# Patient Record
Sex: Male | Born: 1995 | Race: White | Hispanic: No | Marital: Single | Smoking: Never smoker
Health system: Southern US, Community
[De-identification: ages and names within clinical notes are randomized; demographics above are authoritative.]

---
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
## 2016-10-14 ENCOUNTER — Encounter: Payer: Self-pay | Admitting: *Deleted

## 2016-10-14 ENCOUNTER — Ambulatory Visit
Admission: EM | Admit: 2016-10-14 | Discharge: 2016-10-14 | Disposition: A | Discharge status: Home / Self Care | Payer: BLUE CROSS/BLUE SHIELD | Attending: Family Medicine | Admitting: Family Medicine

## 2016-10-14 DIAGNOSIS — J020 Streptococcal pharyngitis: Secondary | ICD-10-CM

## 2016-10-14 LAB — RAPID STREP SCREEN (MED CTR MEBANE ONLY): Streptococcus, Group A Screen (Direct): POSITIVE — AB

## 2016-10-14 MED ORDER — PENICILLIN V POTASSIUM 500 MG PO TABS: 500.0000 mg | ORAL_TABLET | Freq: Three times a day (TID) | ORAL | 0 refills | Status: DC

## 2016-10-14 NOTE — ED Triage Notes (Signed)
Hoarseness, sore throat, intermittent fever, head congestion, runny nose x 3 days.

## 2016-10-14 NOTE — ED Provider Notes (Signed)
[facility]    CSN: [id number] Arrival date & time: 10/14/16  1323     History   Chief Complaint Chief Complaint  Patient presents with  . Sore Throat  . Nasal Congestion  . Hoarse  . Fever    HPI Ricardo Hill is a 21 y.o. male.    Sore Throat  This is a new problem. The current episode started more than 2 days ago. The problem occurs constantly. The problem has been gradually worsening. Pertinent negatives include no chest pain, no abdominal pain, no headaches and no shortness of breath. The symptoms are relieved by acetaminophen and NSAIDs. He has tried acetaminophen for the symptoms. The treatment provided mild relief.  Fever  Associated symptoms: no chest pain and no headaches     History reviewed. No pertinent past medical history.  There are no active problems to display for this patient.   History reviewed. No pertinent surgical history.     Home Medications    Prior to Admission medications   Medication Sig Start Date End Date Taking? Authorizing Provider  azithromycin (ZITHROMAX Z-PAK) 250 MG tablet Use as directed for 5 days. 03/14/15   Jolene ProvostPatel, Kirtida, MD  penicillin v potassium (VEETID) 500 MG tablet Take 1 tablet (500 mg total) by mouth 3 (three) times daily. 10/14/16   Payton Mccallumonty, Nancye Grumbine, MD    Family History History reviewed. No pertinent family history.  Social History Social History  Substance Use Topics  . Smoking status: Never Smoker  . Smokeless tobacco: Current User  . Alcohol use No     Allergies   Patient has no known allergies.   Review of Systems Review of Systems  Constitutional: Positive for fever.  Respiratory: Negative for shortness of breath.   Cardiovascular: Negative for chest pain.  Gastrointestinal: Negative for abdominal pain.  Neurological: Negative for headaches.     Physical Exam Triage Vital Signs ED Triage Vitals  Enc Vitals Group     BP 10/14/16 1358 (!) 125/55     Pulse Rate 10/14/16 1358 84     Resp 10/14/16 1358 16     Temp 10/14/16 1358 98.8 F (37.1 C)     Temp Source 10/14/16 1358 Oral     SpO2 10/14/16 1358 100 %     Weight 10/14/16 1401 220 lb (99.8 kg)     Height 10/14/16 1401 6\' 1"  (1.854 m)     Head Circumference --      Peak Flow --      Pain Score 10/14/16 1435 4     Pain Loc --      Pain Edu? --      Excl. in GC? --    No data found.   Updated Vital Signs BP (!) 125/55 (BP Location: Left Arm)   Pulse 84   Temp 98.8 F (37.1 C) (Oral)   Resp 16   Ht 6\' 1"  (1.854 m)   Wt 220 lb (99.8 kg)   SpO2 100%   BMI 29.03 kg/m   Visual Acuity Right Eye Distance:   Left Eye Distance:   Bilateral Distance:    Right Eye Near:   Left Eye Near:    Bilateral Near:     Physical Exam  Constitutional: He appears well-developed and well-nourished. No distress.  HENT:  Head: Normocephalic and atraumatic.  Right Ear: Tympanic membrane, external ear and ear canal normal.  Left Ear: Tympanic membrane, external ear and ear canal normal.  Nose: Nose normal.  Mouth/Throat:  Uvula is midline and mucous membranes are normal. Posterior oropharyngeal erythema present. No oropharyngeal exudate, posterior oropharyngeal edema or tonsillar abscesses.  Eyes: Pupils are equal, round, and reactive to light. Conjunctivae and EOM are normal. Right eye exhibits no discharge. Left eye exhibits no discharge. No scleral icterus.  Neck: Normal range of motion. Neck supple. No tracheal deviation present. No thyromegaly present.  Cardiovascular: Normal rate, regular rhythm and normal heart sounds.   Pulmonary/Chest: Effort normal and breath sounds normal. No stridor. No respiratory distress. He has no wheezes. He has no rales. He exhibits no tenderness.  Lymphadenopathy:    He has no cervical adenopathy.  Neurological: He is alert.  Skin: Skin is warm and dry. No rash noted. He is not diaphoretic.  Nursing note and vitals reviewed.    UC Treatments / Results  Labs (all labs ordered  are listed, but only abnormal results are displayed) Labs Reviewed  RAPID STREP SCREEN (NOT AT [facility]) - Abnormal; Notable for the following:       Result Value   Streptococcus, Group A Screen (Direct) POSITIVE (*)    All other components within normal limits    EKG  EKG Interpretation None       Radiology No results found.  Procedures Procedures (including critical care time)  Medications Ordered in UC Medications - No data to display   Initial Impression / Assessment and Plan / UC Course  I have reviewed the triage vital signs and the nursing notes.  Pertinent labs & imaging results that were available during my care of the patient were reviewed by me and considered in my medical decision making (see chart for details).       Final Clinical Impressions(s) / UC Diagnoses   Final diagnoses:  Strep pharyngitis    New Prescriptions Discharge Medication List as of 10/14/2016  2:33 PM    START taking these medications   Details  penicillin v potassium (VEETID) 500 MG tablet Take 1 tablet (500 mg total) by mouth 3 (three) times daily., Starting Tue 10/14/2016, Normal       1. Lab results and diagnosis reviewed with patient 2. rx as per orders above; reviewed possible side effects, interactions, risks and benefits  3. Recommend supportive treatment with salt water gargles, otc analgesics prn 4. Follow-up prn if symptoms worsen or don't improve   Payton Mccallum, MD 10/14/16 1526

## 2017-10-26 ENCOUNTER — Ambulatory Visit
Admission: EM | Admit: 2017-10-26 | Discharge: 2017-10-26 | Disposition: A | Discharge status: Home / Self Care | Payer: BLUE CROSS/BLUE SHIELD | Attending: Family Medicine | Admitting: Family Medicine

## 2017-10-26 ENCOUNTER — Ambulatory Visit (INDEPENDENT_AMBULATORY_CARE_PROVIDER_SITE_OTHER): Payer: BLUE CROSS/BLUE SHIELD

## 2017-10-26 ENCOUNTER — Other Ambulatory Visit: Payer: Self-pay

## 2017-10-26 ENCOUNTER — Encounter: Payer: Self-pay | Admitting: Emergency Medicine

## 2017-10-26 DIAGNOSIS — L02419 Cutaneous abscess of limb, unspecified: Secondary | ICD-10-CM

## 2017-10-26 DIAGNOSIS — M25461 Effusion, right knee: Secondary | ICD-10-CM

## 2017-10-26 DIAGNOSIS — L03115 Cellulitis of right lower limb: Secondary | ICD-10-CM

## 2017-10-26 DIAGNOSIS — L02415 Cutaneous abscess of right lower limb: Secondary | ICD-10-CM

## 2017-10-26 DIAGNOSIS — L03119 Cellulitis of unspecified part of limb: Principal | ICD-10-CM

## 2017-10-26 MED ORDER — DOXYCYCLINE HYCLATE 100 MG PO CAPS: 100.0000 mg | ORAL_CAPSULE | Freq: Two times a day (BID) | ORAL | 0 refills | Status: AC

## 2017-10-26 MED ORDER — MUPIROCIN 2 % EX OINT: 1.0000 "application " | TOPICAL_OINTMENT | Freq: Three times a day (TID) | CUTANEOUS | 0 refills | Status: AC

## 2017-10-26 NOTE — ED Triage Notes (Signed)
Patient c/o right knee pain and swelling for the past couple of weeks.  Patient denies any fall or injury.

## 2017-10-26 NOTE — Discharge Instructions (Signed)
Apply warm compresses to your right knee 3-4 times daily, leave in place for 10 minutes.  Then thoroughly dry your knee and apply mupirocin ointment to the area and cover with a gauze dressing secured with Coban.  If any signs or symptoms of infection occur which we discussed including warmth ,drainage ,increasing pain or swelling or red streaks going up your leg ,return to our clinic or go to the emergency room immediately.

## 2017-10-26 NOTE — ED Provider Notes (Signed)
[facility]    CSN: [id number] Arrival date & time: 10/26/17  1043     History   Chief Complaint Chief Complaint  Patient presents with  . Knee Pain    right    HPI Ricardo Hill is a 22 y.o. male.   HPI  22 year old male presents with right anterior knee pain and swelling that has had for couple of weeks.  Denies any fall or injury.  In retrospect he thinks he may have scraped it on a pool when he was exiting.  Over that period of time it seemed that he has lost hair over the area of the tibial tubercle and has noticed swelling and pain along with hardness.  Today he stated the pain was very extensive and even to the point of having to lancet with his own pen knife.  He states that he only got  blood of the incision.  Now is left with eschar over the area.  Had no fever or chills.  He is afebrile today in the office. No  Difficulty with range of motion and has no difficulty with walking.  Works as Personnel officer at [employer].        History reviewed. No pertinent past medical history.  There are no active problems to display for this patient.   History reviewed. No pertinent surgical history.     Home Medications    Prior to Admission medications   Medication Sig Start Date End Date Taking? Authorizing Provider  doxycycline (VIBRAMYCIN) 100 MG capsule Take 1 capsule (100 mg total) by mouth 2 (two) times daily. 10/26/17   Lutricia Feil, PA-C  mupirocin ointment (BACTROBAN) 2 % Apply 1 application topically 3 (three) times daily. 10/26/17   Lutricia Feil, PA-C    Family History History reviewed. No pertinent family history.  Social History Social History   Tobacco Use  . Smoking status: Never Smoker  . Smokeless tobacco: Current User    Types: Chew  Substance Use Topics  . Alcohol use: No  . Drug use: No     Allergies   Patient has no known allergies.   Review of Systems Review of Systems  Constitutional: Positive for activity change.  Negative for appetite change, chills, fatigue and fever.  Musculoskeletal: Positive for arthralgias.  Skin: Positive for wound.  All other systems reviewed and are negative.    Physical Exam Triage Vital Signs ED Triage Vitals  Enc Vitals Group     BP 10/26/17 1053 128/72     Pulse Rate 10/26/17 1053 65     Resp 10/26/17 1053 16     Temp 10/26/17 1053 97.9 F (36.6 C)     Temp Source 10/26/17 1053 Oral     SpO2 10/26/17 1053 100 %     Weight 10/26/17 1050 200 lb (90.7 kg)     Height 10/26/17 1050 6' (1.829 m)     Head Circumference --      Peak Flow --      Pain Score 10/26/17 1050 8     Pain Loc --      Pain Edu? --      Excl. in GC? --    No data found.  Updated Vital Signs BP 128/72 (BP Location: Left Arm)   Pulse 65   Temp 97.9 F (36.6 C) (Oral)   Resp 16   Ht 6' (1.829 m)   Wt 200 lb (90.7 kg)   SpO2 100%   BMI 27.12 kg/m  Visual Acuity Right Eye Distance:   Left Eye Distance:   Bilateral Distance:    Right Eye Near:   Left Eye Near:    Bilateral Near:     Physical Exam  Constitutional: He is oriented to person, place, and time. He appears well-developed and well-nourished. No distress.  HENT:  Head: Normocephalic.  Eyes: Pupils are equal, round, and reactive to light. Right eye exhibits no discharge. Left eye exhibits no discharge.  Neck: Normal range of motion.  Musculoskeletal: Normal range of motion. He exhibits edema and tenderness.  Of the right knee swelling and eschar over 1/2 cm circular lesion over the tibial tubercle.  Extends laterally and medially.  There is no significant fluctuance induration is present under the lesion.  He has full range of motion that is comfortable.  Collateral ligaments and anterior cruciate ligament is intact.  No significant patellofemoral tenderness present.  Neurological: He is alert and oriented to person, place, and time.  Skin: Skin is warm and dry. He is not diaphoretic.  Psychiatric: He has a normal mood  and affect. His behavior is normal. Judgment and thought content normal.  Nursing note and vitals reviewed.        UC Treatments / Results  Labs (all labs ordered are listed, but only abnormal results are displayed) Labs Reviewed - No data to display  EKG None  Radiology Dg Knee Complete 4 Views Right  Result Date: 10/26/2017 CLINICAL DATA:  Anterior right knee pain and swelling since the patient scraped his knee just inferior to the patella while swimming 2 weeks ago. EXAM: RIGHT KNEE - COMPLETE 4+ VIEW COMPARISON:  None. FINDINGS: No evidence of fracture, dislocation, or joint effusion. No evidence of arthropathy or other focal bone abnormality. There is some soft tissue swelling anterior to the patella. No radiopaque foreign body or soft tissue gas is identified. IMPRESSION: Soft tissue swelling anterior to the patella without radiopaque foreign body or soft tissue gas. The study is otherwise normal. Electronically Signed   By: Drusilla Kanner M.D.   On: 10/26/2017 11:42    Procedures Procedures (including critical care time)  Medications Ordered in UC Medications - No data to display  Initial Impression / Assessment and Plan / UC Course  I have reviewed the triage vital signs and the nursing notes.  Pertinent labs & imaging results that were available during my care of the patient were reviewed by me and considered in my medical decision making (see chart for details).     Plan: 1. Test/x-ray results and diagnosis reviewed with patient 2. rx as per orders; risks, benefits, potential side effects reviewed with patient 3. Recommend supportive treatment with warm compresses 3-4 times daily lasting 10 minutes at a time. Dry  Thoroughly and then apply Bactroban ointment to the area of induration. Cover with a gauze dressing secured with co-band.  Treat also with oral antibiotics.  If he notices any worsening or any pointing of the abscess he should return to our clinic for  I&D. 4. F/u prn if symptoms worsen or don't improve  Final Clinical Impressions(s) / UC Diagnoses   Final diagnoses:  Cellulitis and abscess of leg     Discharge Instructions     Apply warm compresses to your right knee 3-4 times daily, leave in place for 10 minutes.  Then thoroughly dry your knee and apply mupirocin ointment to the area and cover with a gauze dressing secured with Coban.  If any signs or symptoms of infection  occur which we discussed including warmth ,drainage ,increasing pain or swelling or red streaks going up your leg ,return to our clinic or go to the emergency room immediately.    ED Prescriptions    Medication Sig Dispense Auth. Provider   doxycycline (VIBRAMYCIN) 100 MG capsule Take 1 capsule (100 mg total) by mouth 2 (two) times daily. 10 capsule Ovid Curdoemer, William P, PA-C   mupirocin ointment (BACTROBAN) 2 % Apply 1 application topically 3 (three) times daily. 22 g Lutricia Feiloemer, William P, PA-C     Controlled Substance Prescriptions [place] Controlled Substance Registry consulted? Not Applicable   Lutricia FeilRoemer, William P, PA-C 10/26/17 1348

## 2017-11-12 ENCOUNTER — Other Ambulatory Visit: Payer: Self-pay | Admitting: Physician Assistant

## 2017-11-12 DIAGNOSIS — R1310 Dysphagia, unspecified: Secondary | ICD-10-CM

## 2017-11-18 ENCOUNTER — Ambulatory Visit: Payer: BLUE CROSS/BLUE SHIELD | Attending: Physician Assistant

## 2018-11-30 ENCOUNTER — Other Ambulatory Visit: Payer: Self-pay | Admitting: Otolaryngology

## 2018-11-30 ENCOUNTER — Other Ambulatory Visit (HOSPITAL_COMMUNITY): Payer: Self-pay | Admitting: Otolaryngology

## 2018-11-30 DIAGNOSIS — R1310 Dysphagia, unspecified: Secondary | ICD-10-CM

## 2018-12-02 ENCOUNTER — Ambulatory Visit: Payer: BLUE CROSS/BLUE SHIELD

## 2018-12-02 ENCOUNTER — Ambulatory Visit
Admission: RE | Admit: 2018-12-02 | Discharge: 2018-12-02 | Disposition: A | Discharge status: Home / Self Care | Payer: BC Managed Care – PPO | Source: Ambulatory Visit | Attending: Otolaryngology | Admitting: Otolaryngology

## 2018-12-02 ENCOUNTER — Other Ambulatory Visit: Payer: Self-pay

## 2018-12-02 DIAGNOSIS — R1310 Dysphagia, unspecified: Secondary | ICD-10-CM | POA: Insufficient documentation

## 2020-07-19 IMAGING — RF DG ESOPHAGUS
8 of 9 series · 14 of 24 positions shown · non-contrast
Comparison: None.

CLINICAL DATA: Indigestion

EXAM:
ESOPHOGRAM / BARIUM SWALLOW / BARIUM TABLET STUDY
TECHNIQUE: Combined double contrast and single contrast examination performed
using effervescent crystals, thick barium liquid, and thin barium
liquid. The patient was observed with fluoroscopy swallowing a 13 mm
barium sulphate tablet.
FLUOROSCOPY TIME:  Fluoroscopy Time:  [DATE]
Number of Acquired Spot Images: 32

[Series 1: fluoro_barium 2fps_bw · 0.18mm/px · 2 of 8 frames shown (1 of 8)]
[frame 2/8]
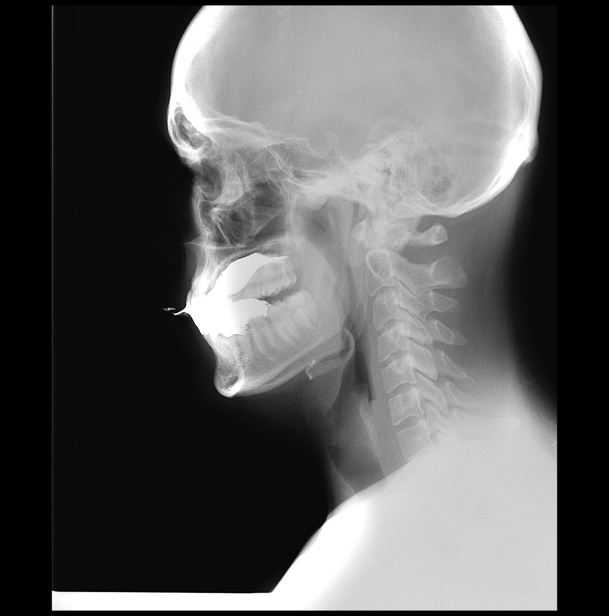
[frame 5/8]
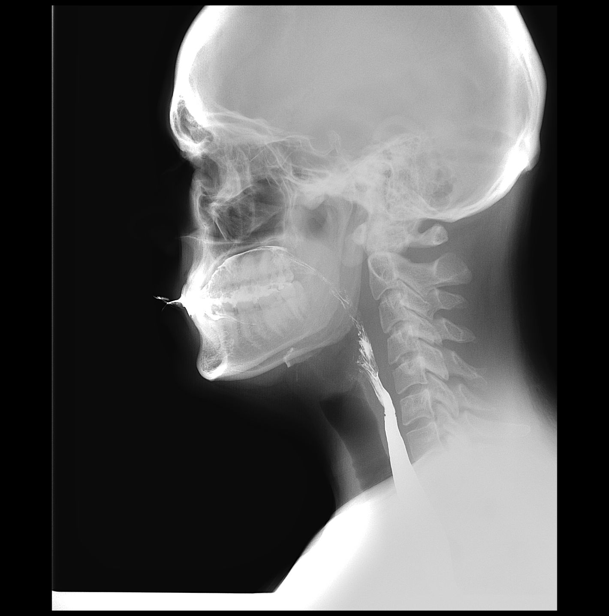

[Series 2: fluoro_barium 2fps_bw · 0.18mm/px · 3 of 6 frames shown (2 of 8)]
[frame 1/6]
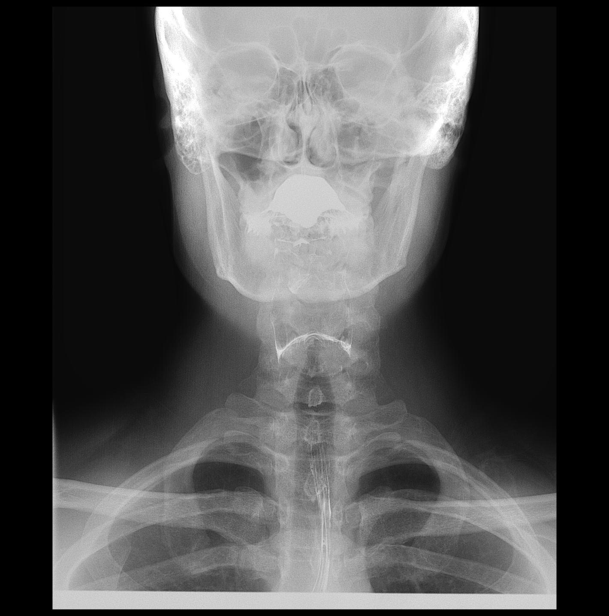
[frame 4/6]
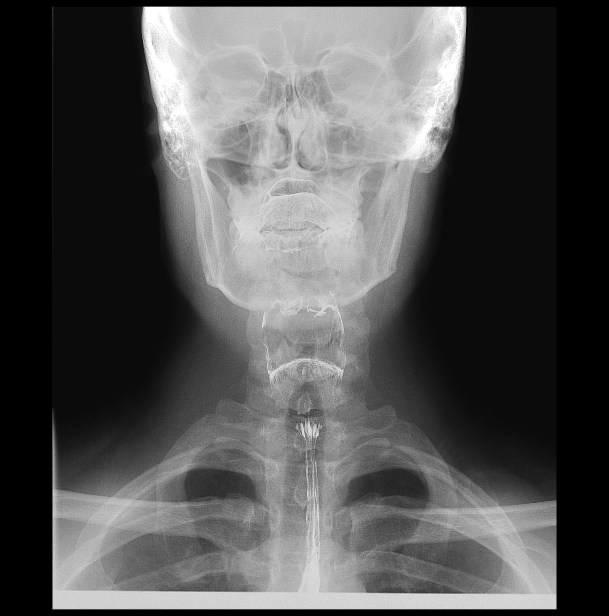
[frame 6/6]
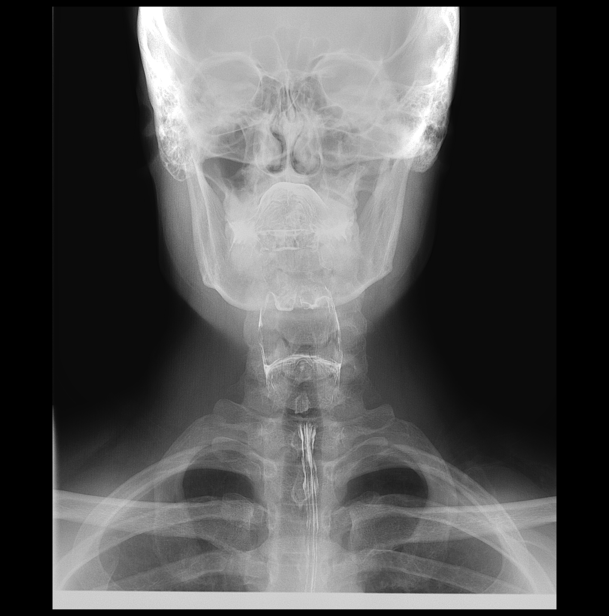

[Series 3: fluoro_barium 2fps_bw · 0.18mm/px · 1 of 4 frames shown (3 of 8)]
[frame 3/4]
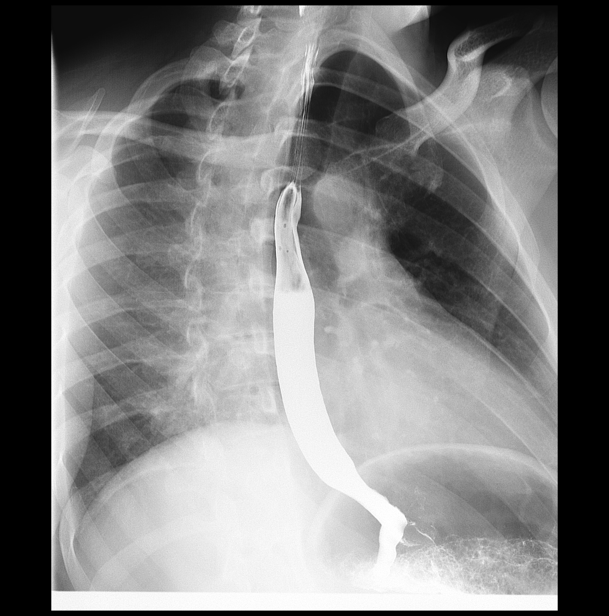

[Series 4: fluoro_barium 2fps_bw · 0.18mm/px · 2 of 3 frames shown (4 of 8)]
[frame 1/3]
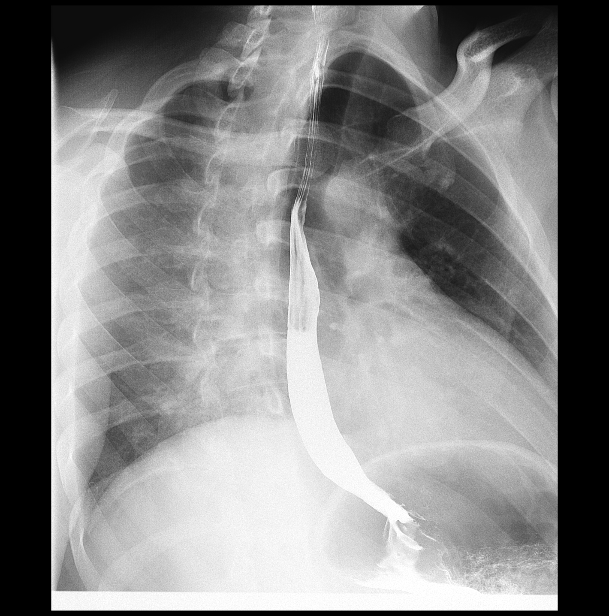
[frame 3/3]
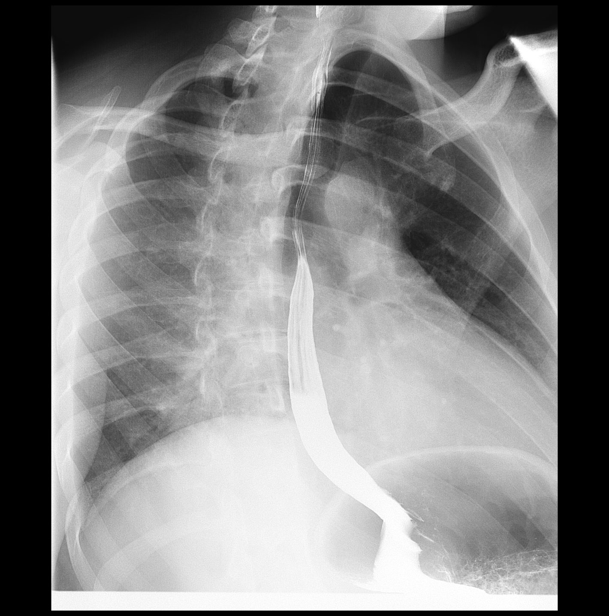

[Series 5: fluoro_barium 2fps_bw · 0.18mm/px · 2 of 4 frames shown (5 of 8)]
[frame 2/4]
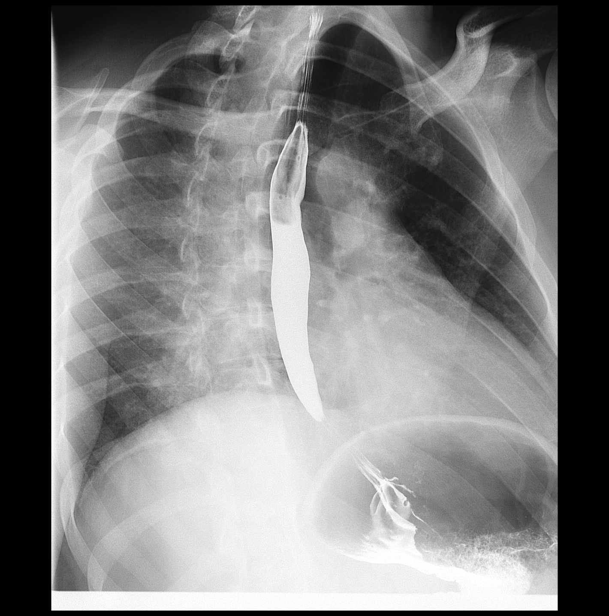
[frame 4/4]
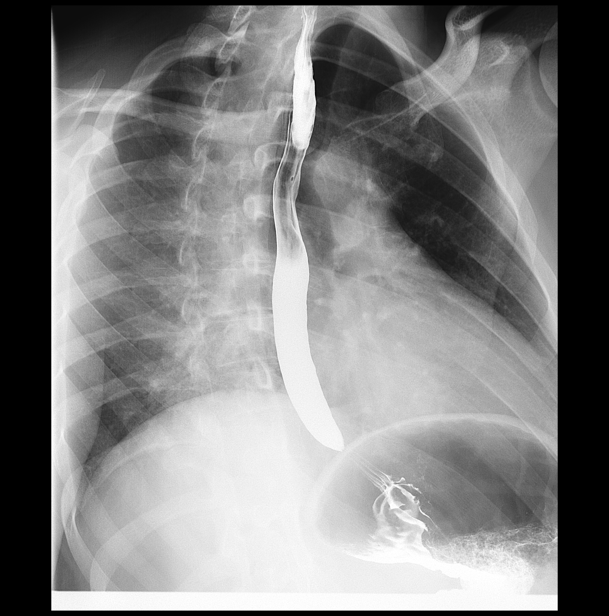

[Series 7: fluoro_barium 2fps_bw · 0.18mm/px · 1 of 1 slices shown (6 of 8)]
[im 1/1]
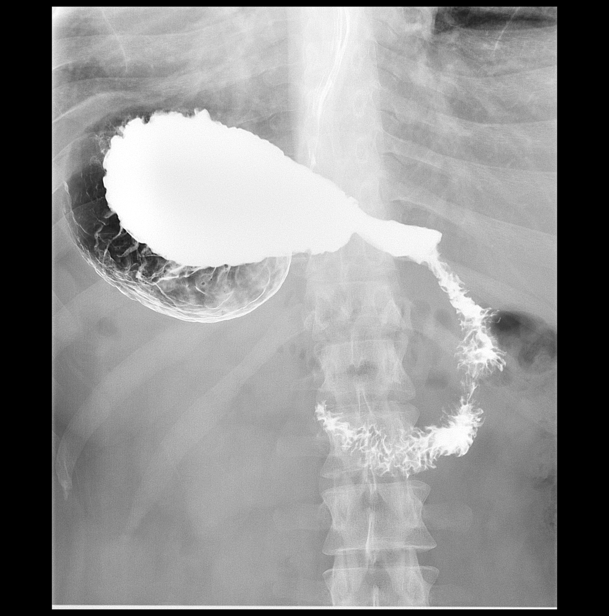

[Series 8: fluoro_barium 2fps_bw · 0.18mm/px · 2 of 3 frames shown (7 of 8)]
[frame 1/3]
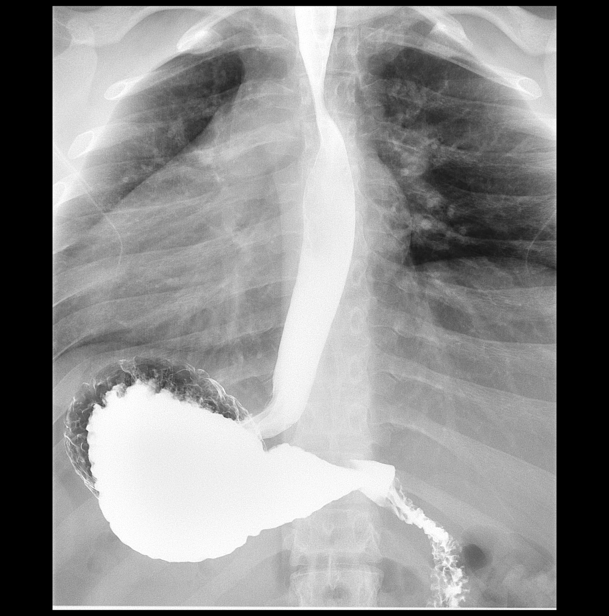
[frame 3/3]
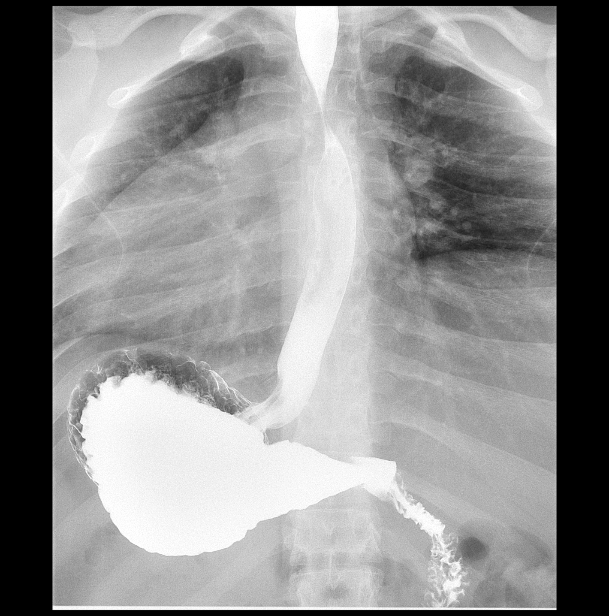

[Series 9: fluoro_barium 2fps_bw · 0.18mm/px · 1 of 2 frames shown (8 of 8)]
[frame 2/2]
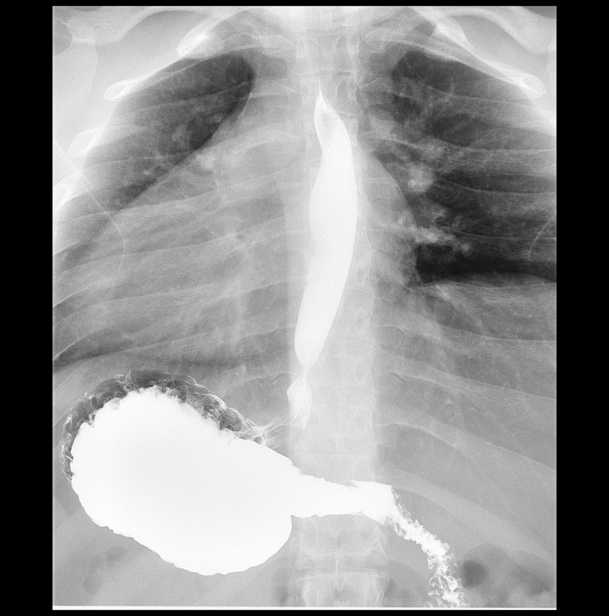

[14 of 24 positions shown; findings below may reference images not displayed]

FINDINGS: Normal oropharyngeal phase of swallow. No evidence of penetration or
aspiration.

Normal contour and caliber of the esophagus. The gastroesophageal
junction is patent. No spontaneous or provoked gastroesophageal
reflux.

Normal partial double contrast appearance of the stomach and
proximal small bowel.

A swallowed barium tablet passes readily through the
gastroesophageal junction.
IMPRESSION: Normal double contrast barium swallow examination.
